# Patient Record
Sex: Male | Born: 2000 | Race: Black or African American | Hispanic: No | Marital: Single | Smoking: Never smoker
Health system: Southern US, Community
[De-identification: ages and names within clinical notes are randomized; demographics above are authoritative.]

## PROBLEM LIST (undated history)

## (undated) DIAGNOSIS — R454 Irritability and anger: Secondary | ICD-10-CM

## (undated) DIAGNOSIS — F329 Major depressive disorder, single episode, unspecified: Secondary | ICD-10-CM

## (undated) DIAGNOSIS — F32A Depression, unspecified: Secondary | ICD-10-CM

---
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
## 2019-06-17 DEATH — deceased
